# Patient Record
Sex: Female | Born: 1955 | Race: White | Hispanic: No | Marital: Married | Smoking: Never smoker
Health system: Southern US, Community
[De-identification: ages and names within clinical notes are randomized; demographics above are authoritative.]

## PROBLEM LIST (undated history)

## (undated) DIAGNOSIS — O159 Eclampsia, unspecified as to time period: Secondary | ICD-10-CM

## (undated) DIAGNOSIS — Z86718 Personal history of other venous thrombosis and embolism: Secondary | ICD-10-CM

## (undated) HISTORY — PX: ABDOMINAL HYSTERECTOMY: SHX81

## (undated) HISTORY — PX: TUBAL LIGATION: SHX77

## (undated) HISTORY — PX: LEG SURGERY: SHX1003

---

## 2003-01-14 ENCOUNTER — Encounter: Payer: Self-pay | Admitting: Family Medicine

## 2003-01-14 ENCOUNTER — Ambulatory Visit (HOSPITAL_COMMUNITY): Admission: RE | Admit: 2003-01-14 | Discharge: 2003-01-14 | Payer: Self-pay | Admitting: Family Medicine

## 2007-01-03 ENCOUNTER — Encounter: Admission: RE | Admit: 2007-01-03 | Discharge: 2007-01-03 | Payer: Self-pay | Admitting: Obstetrics & Gynecology

## 2007-04-04 ENCOUNTER — Ambulatory Visit (HOSPITAL_COMMUNITY): Admission: RE | Admit: 2007-04-04 | Discharge: 2007-04-05 | Payer: Self-pay | Admitting: Obstetrics & Gynecology

## 2014-02-19 ENCOUNTER — Other Ambulatory Visit: Payer: Self-pay | Admitting: Internal Medicine

## 2014-02-19 DIAGNOSIS — R1031 Right lower quadrant pain: Secondary | ICD-10-CM

## 2014-02-20 ENCOUNTER — Ambulatory Visit (HOSPITAL_COMMUNITY): Payer: Self-pay

## 2014-02-20 ENCOUNTER — Other Ambulatory Visit (HOSPITAL_COMMUNITY): Payer: Self-pay

## 2014-02-22 ENCOUNTER — Ambulatory Visit (HOSPITAL_COMMUNITY): Payer: Self-pay

## 2014-04-16 ENCOUNTER — Other Ambulatory Visit: Payer: Self-pay | Admitting: Gastroenterology

## 2014-04-16 DIAGNOSIS — R1031 Right lower quadrant pain: Secondary | ICD-10-CM

## 2014-04-21 ENCOUNTER — Ambulatory Visit
Admission: RE | Admit: 2014-04-21 | Discharge: 2014-04-21 | Disposition: A | Discharge status: Home / Self Care | Source: Ambulatory Visit | Attending: Gastroenterology | Admitting: Gastroenterology

## 2014-04-21 DIAGNOSIS — R1031 Right lower quadrant pain: Secondary | ICD-10-CM

## 2014-04-21 MED ORDER — IOHEXOL 300 MG/ML  SOLN
100.0000 mL | Freq: Once | INTRAMUSCULAR | Status: AC | PRN
  Administered 2014-04-21: 100 mL via INTRAVENOUS

## 2014-04-22 ENCOUNTER — Other Ambulatory Visit: Payer: Self-pay

## 2014-04-23 ENCOUNTER — Other Ambulatory Visit: Payer: Self-pay

## 2015-07-18 ENCOUNTER — Emergency Department (HOSPITAL_BASED_OUTPATIENT_CLINIC_OR_DEPARTMENT_OTHER)
Admission: EM | Admit: 2015-07-18 | Discharge: 2015-07-18 | Disposition: A | Discharge status: Home / Self Care | Attending: Emergency Medicine | Admitting: Emergency Medicine

## 2015-07-18 ENCOUNTER — Encounter (HOSPITAL_BASED_OUTPATIENT_CLINIC_OR_DEPARTMENT_OTHER): Payer: Self-pay | Admitting: *Deleted

## 2015-07-18 ENCOUNTER — Emergency Department (HOSPITAL_BASED_OUTPATIENT_CLINIC_OR_DEPARTMENT_OTHER)

## 2015-07-18 DIAGNOSIS — L539 Erythematous condition, unspecified: Secondary | ICD-10-CM

## 2015-07-18 DIAGNOSIS — R197 Diarrhea, unspecified: Secondary | ICD-10-CM | POA: Diagnosis not present

## 2015-07-18 DIAGNOSIS — M7989 Other specified soft tissue disorders: Secondary | ICD-10-CM

## 2015-07-18 DIAGNOSIS — M79605 Pain in left leg: Secondary | ICD-10-CM | POA: Diagnosis present

## 2015-07-18 DIAGNOSIS — B029 Zoster without complications: Secondary | ICD-10-CM | POA: Diagnosis not present

## 2015-07-18 DIAGNOSIS — M791 Myalgia: Secondary | ICD-10-CM | POA: Insufficient documentation

## 2015-07-18 HISTORY — DX: Eclampsia, unspecified as to time period: O15.9

## 2015-07-18 HISTORY — DX: Personal history of other venous thrombosis and embolism: Z86.718

## 2015-07-18 MED ORDER — PREDNISONE 20 MG PO TABS: 40.0000 mg | ORAL_TABLET | Freq: Every day | ORAL | Status: AC

## 2015-07-18 MED ORDER — VALACYCLOVIR HCL 1 G PO TABS: 1000.0000 mg | ORAL_TABLET | Freq: Three times a day (TID) | ORAL | Status: AC

## 2015-07-18 MED ORDER — PREDNISONE 50 MG PO TABS
60.0000 mg | ORAL_TABLET | Freq: Once | ORAL | Status: AC
  Administered 2015-07-18: 60 mg via ORAL
  Filled 2015-07-18 (×2): qty 1

## 2015-07-18 MED ORDER — HYDROCODONE-ACETAMINOPHEN 5-325 MG PO TABS
1.0000 | ORAL_TABLET | Freq: Once | ORAL | Status: AC
  Administered 2015-07-18: 1 via ORAL
  Filled 2015-07-18: qty 1

## 2015-07-18 MED ORDER — HYDROCODONE-ACETAMINOPHEN 5-325 MG PO TABS: 1.0000 | ORAL_TABLET | Freq: Four times a day (QID) | ORAL | Status: AC | PRN

## 2015-07-18 NOTE — ED Notes (Signed)
Pt reports pain, swelling, and redness to left thigh since yesterday. Hx of blood clots

## 2015-07-18 NOTE — ED Provider Notes (Signed)
CSN: [id number]     Arrival date & time 07/18/15  2044 History  This chart was scribed for Gwyneth Sprout, MD by Ronney Lion, ED Scribe. This patient was seen in room MH11/MH11 and the patient's care was started at 9:21 PM.   Chief Complaint  Patient presents with  . Leg Pain   Patient is a 59 y.o. female presenting with leg pain. The history is provided by the patient. No language interpreter was used.  Leg Pain Location:  Leg Leg location:  L leg Pain details:    Quality:  Cramping   Severity:  Severe   Onset quality:  Gradual   Duration:  1 day   Timing:  Constant   Progression:  Worsening Prior injury to area:  No Relieved by:  Nothing Worsened by:  Extension Ineffective treatments:  None tried Associated symptoms: swelling   Associated symptoms: no fever     HPI Comments: Diane Poole is a 59 y.o. female with a history of superficial blood clots not requiring anticoagulation and leg surgery, who presents to the Emergency Department complaining of constant, severe left leg pain, swelling, and redness that began yesterday.  She denies any known trauma or injury. Patient reports a history of superficial blood clots, but states the pain today feels more severe than any of her prior blood clots. She notes diarrhea for 2-3 hours, secondary to the severity of the pain. She also reports difficulty straightening her leg secondary to the pain. She states she has not yet tried any treatments for her symptoms. She is not currently on any anti-coagulation therapy and denies a history of DVT. Patient has had surgery on her left leg due to varicose veins and reports she has chronic intermittent left leg spasms since. She denies any recent illness, contact with anyone with shingles or chickenpox, or any immunocompromise. She denies fever.    Past Medical History  Diagnosis Date  . H/O blood clots   . Eclampsia    Past Surgical History  Procedure Laterality Date  . Leg surgery    .  Abdominal hysterectomy    . Tubal ligation     No family history on file. Social History  Substance Use Topics  . Smoking status: Never Smoker   . Smokeless tobacco: Never Used  . Alcohol Use: Yes     Comment: occasional   OB History    No data available     Review of Systems  Constitutional: Negative for fever.  Gastrointestinal: Positive for diarrhea (secondary to pain, resolved).  Musculoskeletal: Positive for myalgias (left thigh pain).  Skin: Positive for color change (redness).  Allergic/Immunologic: Negative for immunocompromised state.  All other systems reviewed and are negative.     Allergies  Review of patient's allergies indicates no known allergies.  Home Medications   Prior to Admission medications   Not on File   BP 121/75 mmHg  Pulse 83  Temp(Src) 98.8 F (37.1 C) (Oral)  Resp 18  Ht  (1.651 m)  Wt 175 lb (79.379 kg)  BMI 29.12 kg/m2  SpO2 99% Physical Exam  Constitutional: She is oriented to person, place, and time. She appears well-developed and well-nourished. No distress.  HENT:  Head: Normocephalic and atraumatic.  Eyes: Conjunctivae and EOM are normal.  Neck: Neck supple. No tracheal deviation present.  Cardiovascular: Normal rate.   Pulmonary/Chest: Effort normal. No respiratory distress.  Musculoskeletal: Normal range of motion.  Swelling and redness present in the left medial thigh. Erythema  tracking from the medial thigh to the knee. Second area of redness from the medial mid lower leg to the ankle. In the L3-L4 dermatome.  Area is tender to touch and warm. No vesicles. No crepitus  Neurological: She is alert and oriented to person, place, and time.  Skin: Skin is warm and dry.  Psychiatric: She has a normal mood and affect. Her behavior is normal.  Nursing note and vitals reviewed.   ED Course  Procedures (including critical care time)  DIAGNOSTIC STUDIES: Oxygen Saturation is 99% on RA, normal by my interpretation.     COORDINATION OF CARE: 9:25 PM - Discussed treatment plan with pt at bedside which includes U/S to r/o blood clots. She declines pain medication at this time. Pt verbalized understanding and agreed to plan.    Labs Review Labs Reviewed - No data to display  Imaging Review US Venous Img Lower Unilateral Left  07/18/2015   CLINICAL DATA:  History of thrombophlebitis. Red streaks and pain left medial thigh.  EXAM: Left LOWER EXTREMITY VENOUS DOPPLER ULTRASOUND  TECHNIQUE: Gray-scale sonography with graded compression, as well as color Doppler and duplex ultrasound were performed to evaluate the lower extremity deep venous systems from the level of the common femoral vein and including the common femoral, femoral, profunda femoral, popliteal and calf veins including the posterior tibial, peroneal and gastrocnemius veins when visible. The superficial great saphenous vein was also interrogated. Spectral Doppler was utilized to evaluate flow at rest and with distal augmentation maneuvers in the common femoral, femoral and popliteal veins.  COMPARISON:  None.  FINDINGS: Contralateral Common Femoral Vein: Respiratory phasicity is normal and symmetric with the symptomatic side. No evidence of thrombus. Normal compressibility.  Common Femoral Vein: No evidence of thrombus. Normal compressibility, respiratory phasicity and response to augmentation.  Saphenofemoral Junction: No evidence of thrombus. Normal compressibility and flow on color Doppler imaging.  Profunda Femoral Vein: No evidence of thrombus. Normal compressibility and flow on color Doppler imaging.  Femoral Vein: No evidence of thrombus. Normal compressibility, respiratory phasicity and response to augmentation.  Popliteal Vein: No evidence of thrombus. Normal compressibility, respiratory phasicity and response to augmentation.  Calf Veins: Posterior tibial veins within normal as peroneal veins not well visualized.  Superficial Great Saphenous Vein: No  evidence of thrombus. Normal compressibility and flow on color Doppler imaging.  Venous Reflux:  None.  Other Findings:  Varicose vein over the popliteal fossa region.  IMPRESSION: No evidence of deep venous thrombosis.   Electronically Signed   By: Elberta Fortis M.D.   On: 07/18/2015 22:50   I have personally reviewed and evaluated these images and lab results as part of my medical decision-making.   EKG Interpretation None      MDM   Final diagnoses:  Shingles   Patient with erythema, pain and warmth in the medial thigh and lower left leg area. It started suddenly yesterday. Pain started first and then rash to follow up quickly after. She has a history of superficial thrombophlebitis but no history of DVTs. She is not taking anticoagulation and denies any trauma. She denies any infectious symptoms such as fever. On exam patient has a raised red tender rash involving the medial thigh and medial lower left leg in the L3/L4 dermatomal pattern. Duplex ultrasound is negative for clot. Feel that patient could have early shingles based on the nature of the pain in where the rash is located. Lower suspicion that this is cellulitis as there is no abscess or  fluctuance present. No cat Scratches or bites.  Will start patient on Valtrex, prednisone and pain medication. She was given strict return precautions if she develops fever, rash starts involving the entire leg or any other concerns.  I personally performed the services described in this documentation, which was scribed in my presence.  The recorded information has been reviewed and considered.     Gwyneth Sprout, MD 07/18/15 [phone]

## 2015-12-25 IMAGING — CT CT ABD-PELV W/ CM
2 of 5 series · 17 of 46 positions shown, 19 images · IV contrast (READICAT/WATER & [ID] OMNI 300)
Comparison: None.

CLINICAL DATA: Right lower quadrant pain for 4 months.

EXAM:
CT ABDOMEN AND PELVIS WITH CONTRAST
TECHNIQUE: Multidetector CT imaging of the abdomen and pelvis was performed
using the standard protocol following bolus administration of
intravenous contrast.
CONTRAST:  100mL OMNIPAQUE IOHEXOL 300 MG/ML  SOLN

[Series 2: abd/pelvis with · axial · 0.72mm/px · z∈[-385,-15]mm · 14 of 84 slices shown, 16 images]
[im 5/84  soft-tissue]
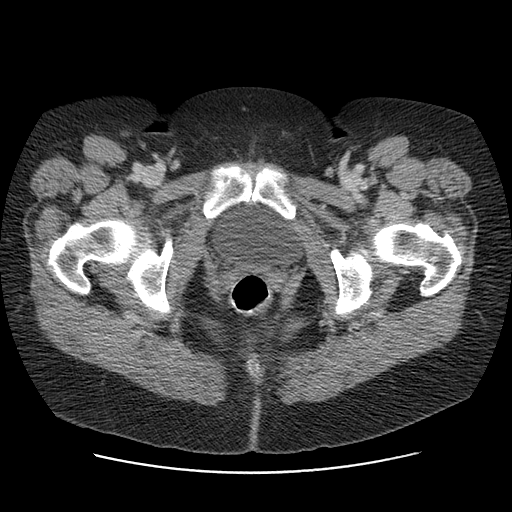
[im 5/84  bone]
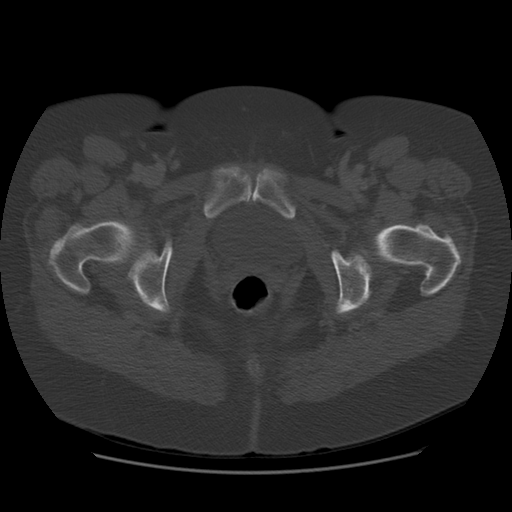
[im 10/84  soft-tissue]
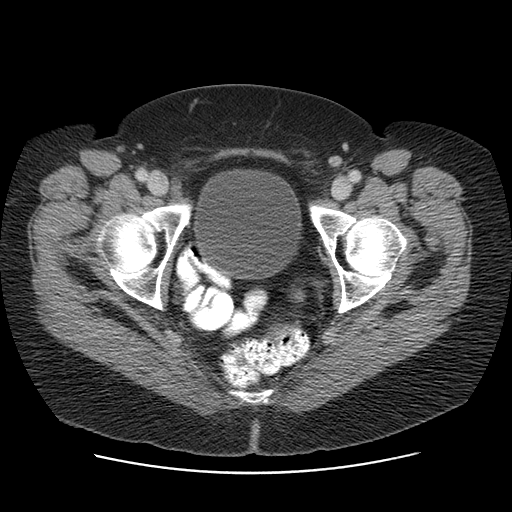
[im 15/84  soft-tissue]
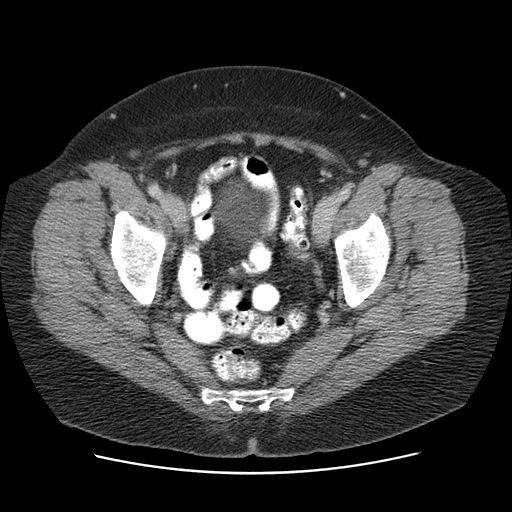
[im 25/84  soft-tissue]
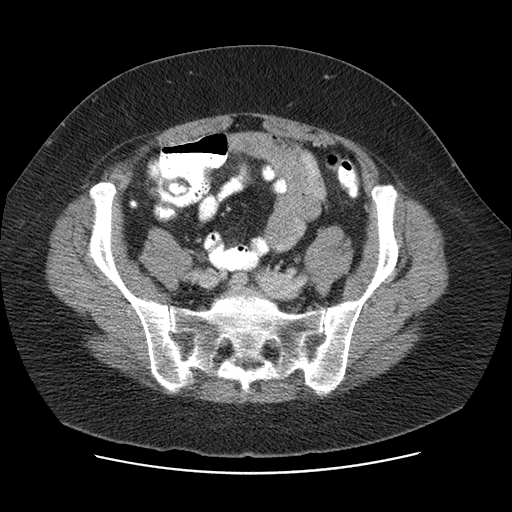
[im 30/84  soft-tissue]
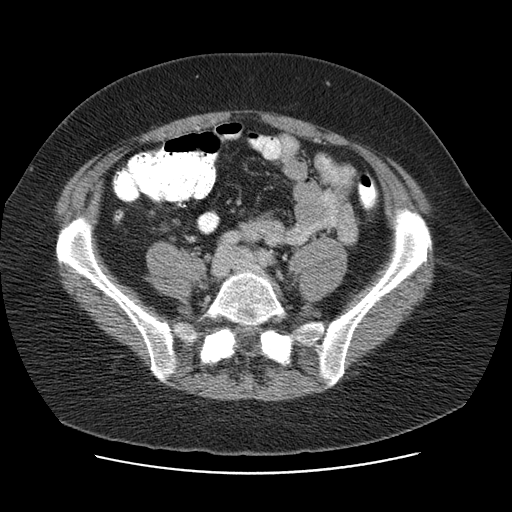
[im 35/84  soft-tissue]
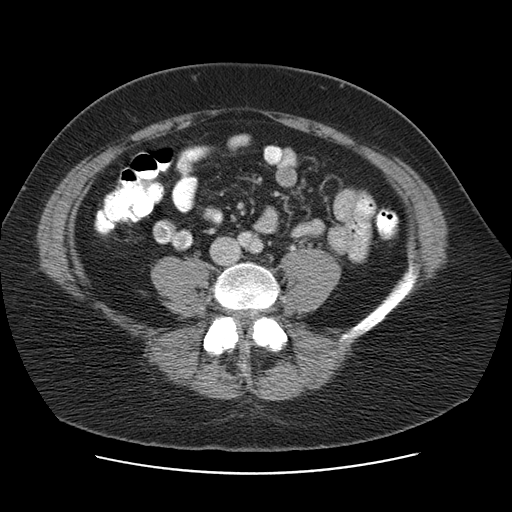
[im 40/84  soft-tissue]
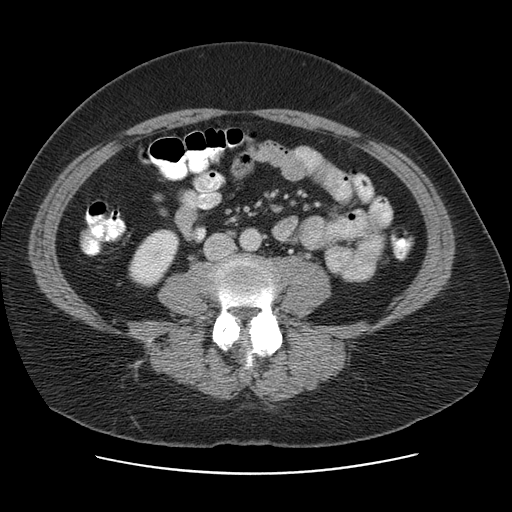
[im 44/84  soft-tissue]
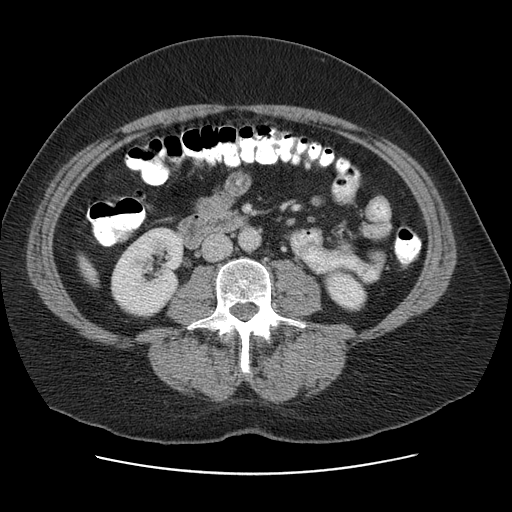
[im 49/84  soft-tissue]
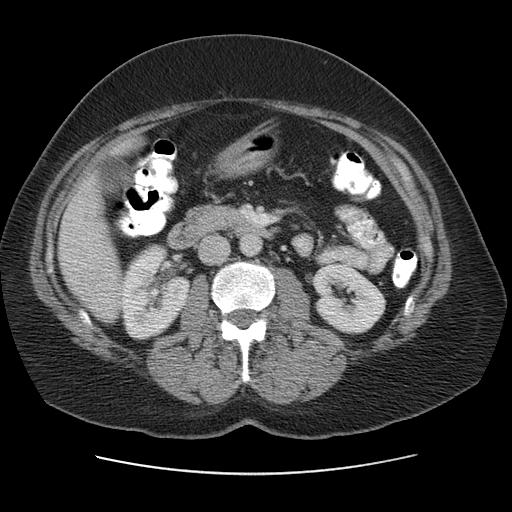
[im 49/84  bone]
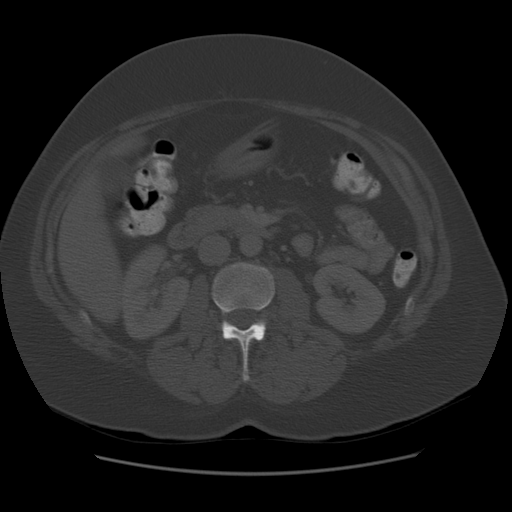
[im 54/84  soft-tissue]
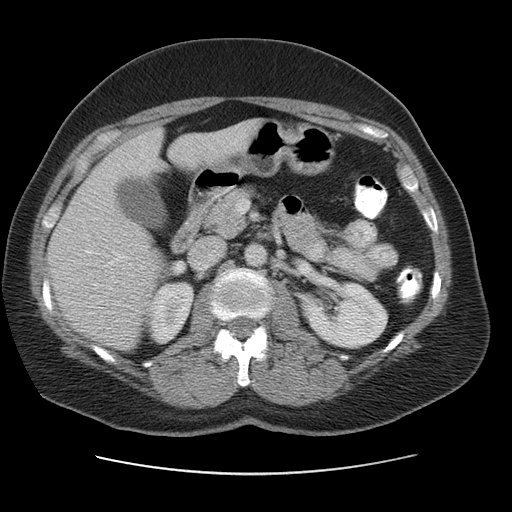
[im 64/84  soft-tissue]
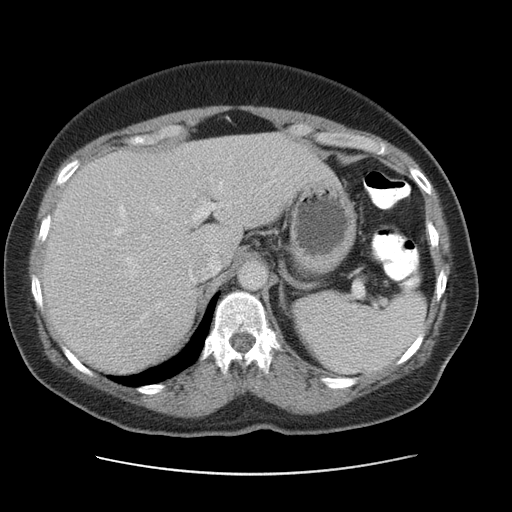
[im 69/84  soft-tissue]
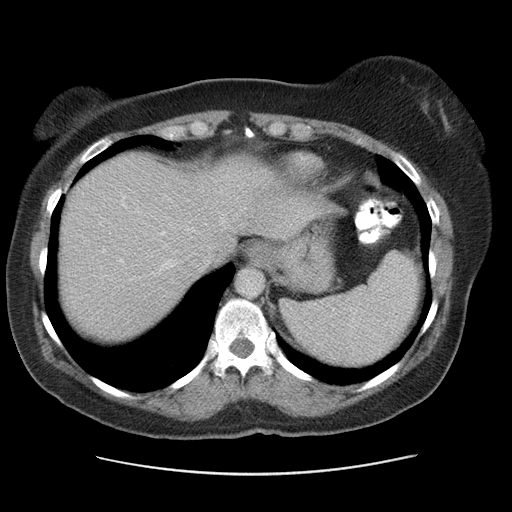
[im 74/84  soft-tissue]
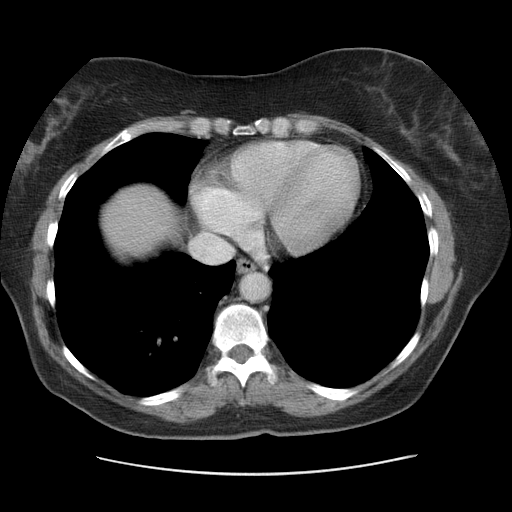
[im 79/84  soft-tissue]
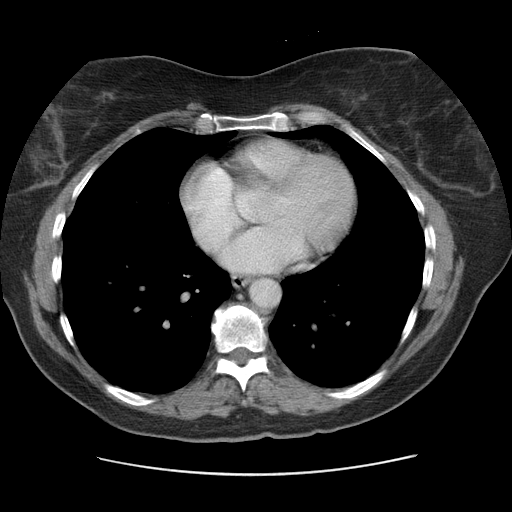

[Series 400: cor · coronal · 0.94mm/px · 3 of 133 slices shown]
[im 45/133  soft-tissue]
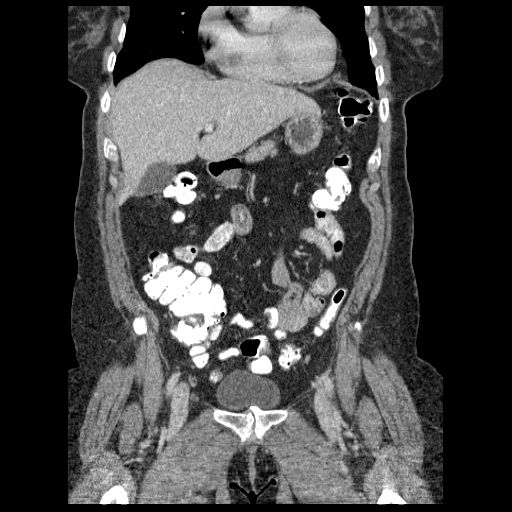
[im 59/133  soft-tissue]
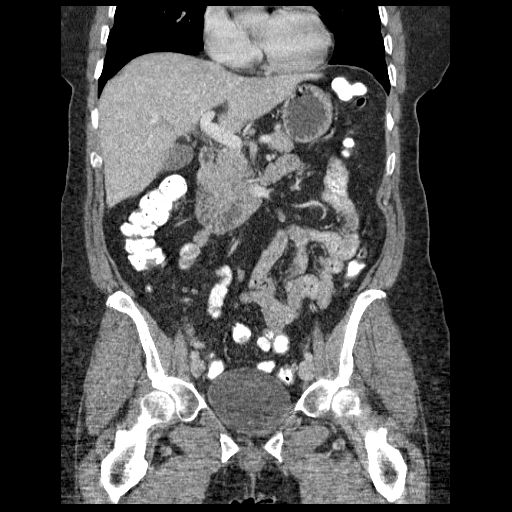
[im 74/133  soft-tissue]
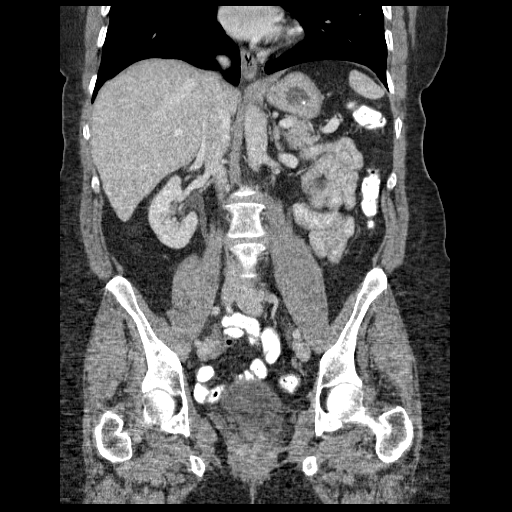

[17 of 46 positions shown; findings below may reference images not displayed]

FINDINGS: Lung Bases: Minimal atelectasis dependently. Otherwise unremarkable.

Liver: Scattered tiny (5 mm) hypodensities in the lateral segment
left liver and medial segment left liver are most suggestive of
cysts.

Spleen: Normal

Stomach: Normal

Pancreas: No focal mass lesion.  No dilatation of the main duct.

Gallbladder/Biliary Tree: No gallstones. No intra or extra hepatic
biliary duct dilatation.

Kidneys/Adrenals: No adrenal nodule or mass. The kidneys are normal
bilaterally.

Bowel Loops: Normal duodenum. No small bowel dilatation or small
bowel wall thickening. Terminal ileum is normal. Appendix is normal.
Scattered diverticular changes seen throughout the colon without
evidence for diverticulitis.

Nodes: No abdominal lymphadenopathy. No pelvic sidewall
lymphadenopathy.

Vasculature: No abdominal aortic aneurysm.

Pelvic Genitourinary: Bladder is normal. The uterus is surgically
absent. There is no adnexal mass.

Bones/Musculoskeletal: Bone windows reveal no worrisome lytic or
sclerotic osseous lesions.

Body Wall: A left groin hernia contains only fat.

Other: No intraperitoneal free fluid.
IMPRESSION: No acute findings. Specifically, no findings to explain the
patient's history of right lower quadrant pain.
